# Patient Record
Sex: Female | Born: 1945 | Race: White | Hispanic: No | Smoking: Former smoker
Health system: Southern US, Community
[De-identification: ages and names within clinical notes are randomized; demographics above are authoritative.]

## PROBLEM LIST (undated history)

## (undated) DIAGNOSIS — C801 Malignant (primary) neoplasm, unspecified: Secondary | ICD-10-CM

## (undated) DIAGNOSIS — E119 Type 2 diabetes mellitus without complications: Secondary | ICD-10-CM

## (undated) HISTORY — PX: MASTECTOMY: SHX3

---

## 2017-05-02 ENCOUNTER — Other Ambulatory Visit: Payer: Self-pay | Admitting: Family Medicine

## 2017-05-02 DIAGNOSIS — E2839 Other primary ovarian failure: Secondary | ICD-10-CM

## 2017-06-13 ENCOUNTER — Ambulatory Visit
Admission: RE | Admit: 2017-06-13 | Discharge: 2017-06-13 | Disposition: A | Discharge status: Home / Self Care | Payer: Medicare Other | Source: Ambulatory Visit | Attending: Family Medicine | Admitting: Family Medicine

## 2017-06-13 ENCOUNTER — Ambulatory Visit
Admission: RE | Admit: 2017-06-13 | Discharge: 2017-06-13 | Disposition: A | Discharge status: Home / Self Care | Payer: Self-pay | Source: Ambulatory Visit | Attending: Family Medicine | Admitting: Family Medicine

## 2017-06-13 DIAGNOSIS — E2839 Other primary ovarian failure: Secondary | ICD-10-CM

## 2017-07-31 ENCOUNTER — Other Ambulatory Visit: Payer: Self-pay | Admitting: Family Medicine

## 2017-07-31 DIAGNOSIS — Z1231 Encounter for screening mammogram for malignant neoplasm of breast: Secondary | ICD-10-CM

## 2017-10-05 ENCOUNTER — Other Ambulatory Visit: Payer: Self-pay

## 2017-10-05 ENCOUNTER — Encounter (HOSPITAL_COMMUNITY): Payer: Self-pay

## 2017-10-05 ENCOUNTER — Emergency Department (HOSPITAL_COMMUNITY)
Admission: EM | Admit: 2017-10-05 | Discharge: 2017-10-05 | Disposition: A | Discharge status: Home / Self Care | Payer: Medicare Other | Attending: Emergency Medicine | Admitting: Emergency Medicine

## 2017-10-05 DIAGNOSIS — Z87891 Personal history of nicotine dependence: Secondary | ICD-10-CM | POA: Insufficient documentation

## 2017-10-05 DIAGNOSIS — E119 Type 2 diabetes mellitus without complications: Secondary | ICD-10-CM | POA: Insufficient documentation

## 2017-10-05 DIAGNOSIS — Z859 Personal history of malignant neoplasm, unspecified: Secondary | ICD-10-CM | POA: Diagnosis not present

## 2017-10-05 DIAGNOSIS — R42 Dizziness and giddiness: Secondary | ICD-10-CM | POA: Diagnosis present

## 2017-10-05 DIAGNOSIS — H8120 Vestibular neuronitis, unspecified ear: Secondary | ICD-10-CM

## 2017-10-05 HISTORY — DX: Type 2 diabetes mellitus without complications: E11.9

## 2017-10-05 HISTORY — DX: Malignant (primary) neoplasm, unspecified: C80.1

## 2017-10-05 LAB — COMPREHENSIVE METABOLIC PANEL
ALK PHOS: 91 U/L (ref 38–126)
ALT: 16 U/L (ref 0–44)
AST: 16 U/L (ref 15–41)
Albumin: 4.2 g/dL (ref 3.5–5.0)
Anion gap: 10 (ref 5–15)
BILIRUBIN TOTAL: 0.5 mg/dL (ref 0.3–1.2)
BUN: 16 mg/dL (ref 8–23)
CALCIUM: 9.3 mg/dL (ref 8.9–10.3)
CO2: 27 mmol/L (ref 22–32)
Chloride: 102 mmol/L (ref 98–111)
Creatinine, Ser: 0.66 mg/dL (ref 0.44–1.00)
GFR calc non Af Amer: >60 mL/min (ref >60)
Glucose, Bld: 128 mg/dL — ABNORMAL HIGH (ref 70–99)
POTASSIUM: 3.9 mmol/L (ref 3.5–5.1)
Sodium: 139 mmol/L (ref 135–145)
TOTAL PROTEIN: 7.1 g/dL (ref 6.5–8.1)

## 2017-10-05 LAB — CBG MONITORING, ED: GLUCOSE-CAPILLARY: 157 mg/dL — AB (ref 70–99)

## 2017-10-05 LAB — CBC WITH DIFFERENTIAL/PLATELET
Basophils Absolute: 0 10*3/uL (ref 0.0–0.1)
Basophils Relative: 0 %
EOS PCT: 0 %
Eosinophils Absolute: 0 10*3/uL (ref 0.0–0.7)
HCT: 39.6 % (ref 36.0–46.0)
HEMOGLOBIN: 13.4 g/dL (ref 12.0–15.0)
LYMPHS ABS: 1.4 10*3/uL (ref 0.7–4.0)
LYMPHS PCT: 14 %
MCH: 29.6 pg (ref 26.0–34.0)
MCHC: 33.8 g/dL (ref 30.0–36.0)
MCV: 87.4 fL (ref 78.0–100.0)
MONO ABS: 0.7 10*3/uL (ref 0.1–1.0)
MONOS PCT: 6 %
Neutro Abs: 8.2 10*3/uL — ABNORMAL HIGH (ref 1.7–7.7)
Neutrophils Relative %: 80 %
Platelets: 261 10*3/uL (ref 150–400)
RBC: 4.53 MIL/uL (ref 3.87–5.11)
RDW: 12.6 % (ref 11.5–15.5)
WBC: 10.3 10*3/uL (ref 4.0–10.5)

## 2017-10-05 LAB — URINALYSIS, ROUTINE W REFLEX MICROSCOPIC
Bilirubin Urine: NEGATIVE
Glucose, UA: NEGATIVE mg/dL
Hgb urine dipstick: NEGATIVE
KETONES UR: 5 mg/dL — AB
Leukocytes, UA: NEGATIVE
NITRITE: NEGATIVE
PROTEIN: NEGATIVE mg/dL
Specific Gravity, Urine: 1.017 (ref 1.005–1.030)
pH: 7 (ref 5.0–8.0)

## 2017-10-05 LAB — LIPASE, BLOOD: Lipase: 40 U/L (ref 11–51)

## 2017-10-05 MED ORDER — METHYLPREDNISOLONE SODIUM SUCC 125 MG IJ SOLR
125.0000 mg | Freq: Once | INTRAMUSCULAR | Status: AC
  Administered 2017-10-05: 125 mg via INTRAVENOUS
  Filled 2017-10-05: qty 2

## 2017-10-05 MED ORDER — MECLIZINE HCL 25 MG PO TABS
25.0000 mg | ORAL_TABLET | Freq: Once | ORAL | Status: AC
  Administered 2017-10-05: 25 mg via ORAL
  Filled 2017-10-05: qty 1

## 2017-10-05 MED ORDER — MECLIZINE HCL 25 MG PO TABS: 25.0000 mg | ORAL_TABLET | Freq: Three times a day (TID) | ORAL | 0 refills | Status: AC | PRN

## 2017-10-05 MED ORDER — PREDNISONE 20 MG PO TABS: ORAL_TABLET | ORAL | 0 refills | Status: AC

## 2017-10-05 MED ORDER — LACTATED RINGERS IV BOLUS
1000.0000 mL | Freq: Once | INTRAVENOUS | Status: AC
  Administered 2017-10-05: 1000 mL via INTRAVENOUS

## 2017-10-05 NOTE — ED Triage Notes (Addendum)
EMS reports patient was having Nausea, Vomiting, abdominal pain staring approx 1 hour ago. Patient started new abx yesterday for sinus infection. Hx of Diabetes. A/O x4 -- 4mg  zofran IV given via EMS. Patient states she feels better.

## 2017-10-05 NOTE — ED Provider Notes (Signed)
[facility] DEPT Provider Note   CSN: [id number] Arrival date & time: 10/05/17  1828     History   Chief Complaint Chief Complaint  Patient presents with  . Nausea  . Emesis  . Abdominal Pain    HPI Teresa Harrison is a 72 y.o. female.  Currently being treated for sinus infection.  Today she had an episode where she was getting out of car and she has significant vertigo where she had to sit back down.  Had to call her son she had nausea and multiple episodes of vomiting as well.  She is astigmatic at this time.  No headache.  No chest pain.   Emesis   This is a new problem. The problem occurs 2 to 4 times per day. The problem has not changed since onset.There has been no fever. Associated symptoms include abdominal pain.  Abdominal Pain   Chronicity: after vomiting. The current episode started 3 to 5 hours ago. The problem occurs constantly. The pain is located in the generalized abdominal region. Associated symptoms include vomiting.  Dizziness  Associated symptoms: vomiting     Past Medical History:  Diagnosis Date  . Cancer ([place])   . Diabetes mellitus without complication ([place])     There are no active problems to display for this patient.   The histories are not reviewed yet. Please review them in the "History" navigator section and refresh this [place].   OB History    Gravida  6   Para  6   Term  4   Preterm  2   AB      Living  6     SAB      TAB      Ectopic      Multiple      Live Births               Home Medications    Prior to Admission medications   Medication Sig Start Date End Date Taking? Authorizing Provider  meclizine (ANTIVERT) 25 MG tablet Take 1 tablet (25 mg total) by mouth 3 (three) times daily as needed for dizziness. 10/05/17   [place] Fritze, Corene Cornea, MD  predniSONE (DELTASONE) 20 MG tablet 3 tabs po daily x 3 days, then 2 tabs x 3 days, then 1.5 tabs x 3 days, then 1 tab x 3 days, then 0.5 tabs  x 3 days 10/05/17   Rodricus Candelaria, Corene Cornea, MD    Family History No family history on file.  Social History Social History   Tobacco Use  . Smoking status: Former Research scientist (life sciences)  . Smokeless tobacco: Never Used  Substance Use Topics  . Alcohol use: Not Currently  . Drug use: Not Currently     Allergies   Patient has no allergy information on record.   Review of Systems Review of Systems  Gastrointestinal: Positive for abdominal pain and vomiting.  Neurological: Positive for dizziness.  All other systems reviewed and are negative.    Physical Exam Updated Vital Signs BP (!) 152/71 (BP Location: Left Arm)   Pulse 67   Temp 98.3 F (36.8 C) (Oral)   Resp 17   Ht 5\' 5"  (1.651 m)   Wt 77.6 kg   SpO2 100%   BMI 28.46 kg/m   Physical Exam  Constitutional: She is oriented to person, place, and time. She appears well-developed and well-nourished.  HENT:  Head: Normocephalic and atraumatic.  Eyes: Conjunctivae and EOM are normal.  Neck: Normal range of motion.  Cardiovascular: Normal rate and regular rhythm.  Pulmonary/Chest: Effort normal. No stridor. No respiratory distress.  Abdominal: Soft. She exhibits no distension.  Neurological: She is alert and oriented to person, place, and time.  Skin: Skin is warm and dry.  Nursing note and vitals reviewed.    ED Treatments / Results  Labs (all labs ordered are listed, but only abnormal results are displayed) Labs Reviewed  CBC WITH DIFFERENTIAL/PLATELET - Abnormal; Notable for the following components:      Result Value   Neutro Abs 8.2 (*)    All other components within normal limits  COMPREHENSIVE METABOLIC PANEL - Abnormal; Notable for the following components:   Glucose, Bld 128 (*)    All other components within normal limits  URINALYSIS, ROUTINE W REFLEX MICROSCOPIC - Abnormal; Notable for the following components:   Ketones, ur 5 (*)    All other components within normal limits  CBG MONITORING, ED - Abnormal; Notable  for the following components:   Glucose-Capillary 157 (*)    All other components within normal limits  LIPASE, BLOOD    EKG None  Radiology No results found.  Procedures Procedures (including critical care time)  Medications Ordered in ED Medications  meclizine (ANTIVERT) tablet 25 mg (25 mg Oral Given 10/05/17 1959)  lactated ringers bolus 1,000 mL (0 mLs Intravenous Stopped 10/05/17 2147)  methylPREDNISolone sodium succinate (SOLU-MEDROL) 125 mg/2 mL injection 125 mg (125 mg Intravenous Given 10/05/17 1956)     Initial Impression / Assessment and Plan / ED Course  I have reviewed the triage vital signs and the nursing notes.  Pertinent labs & imaging results that were available during my care of the patient were reviewed by me and considered in my medical decision making (see chart for details).   Here with likely vestibular neuritis. Steroids/meclizine/continue antibiotics. Stable for dc. Low suspicion for CVA  Final Clinical Impressions(s) / ED Diagnoses   Final diagnoses:  Vertigo  Vestibular neuronitis, unspecified laterality    ED Discharge Orders         Ordered    meclizine (ANTIVERT) 25 MG tablet  3 times daily PRN     10/05/17 2134    predniSONE (DELTASONE) 20 MG tablet     10/05/17 2134           Deverick Pruss, Corene Cornea, MD 10/06/17 0000

## 2017-10-21 ENCOUNTER — Ambulatory Visit
Admission: RE | Admit: 2017-10-21 | Discharge: 2017-10-21 | Disposition: A | Discharge status: Home / Self Care | Payer: Medicare Other | Source: Ambulatory Visit | Attending: Family Medicine | Admitting: Family Medicine

## 2017-10-21 DIAGNOSIS — Z1231 Encounter for screening mammogram for malignant neoplasm of breast: Secondary | ICD-10-CM

## 2018-09-17 ENCOUNTER — Other Ambulatory Visit: Payer: Self-pay | Admitting: Family Medicine

## 2018-09-17 DIAGNOSIS — Z1231 Encounter for screening mammogram for malignant neoplasm of breast: Secondary | ICD-10-CM

## 2018-11-03 ENCOUNTER — Ambulatory Visit
Admission: RE | Admit: 2018-11-03 | Discharge: 2018-11-03 | Disposition: A | Discharge status: Home / Self Care | Payer: Medicare Other | Source: Ambulatory Visit | Attending: Family Medicine | Admitting: Family Medicine

## 2018-11-03 ENCOUNTER — Other Ambulatory Visit: Payer: Self-pay

## 2018-11-03 DIAGNOSIS — Z1231 Encounter for screening mammogram for malignant neoplasm of breast: Secondary | ICD-10-CM

## 2019-10-26 ENCOUNTER — Other Ambulatory Visit: Payer: Self-pay | Admitting: Family Medicine

## 2019-10-26 DIAGNOSIS — Z1231 Encounter for screening mammogram for malignant neoplasm of breast: Secondary | ICD-10-CM

## 2019-12-02 ENCOUNTER — Ambulatory Visit: Payer: Medicare Other

## 2019-12-11 ENCOUNTER — Other Ambulatory Visit: Payer: Self-pay

## 2019-12-11 ENCOUNTER — Ambulatory Visit
Admission: RE | Admit: 2019-12-11 | Discharge: 2019-12-11 | Disposition: A | Discharge status: Home / Self Care | Payer: Medicare Other | Source: Ambulatory Visit | Attending: Family Medicine | Admitting: Family Medicine

## 2019-12-11 DIAGNOSIS — Z1231 Encounter for screening mammogram for malignant neoplasm of breast: Secondary | ICD-10-CM

## 2020-10-05 ENCOUNTER — Other Ambulatory Visit: Payer: Self-pay | Admitting: Family Medicine

## 2020-10-05 DIAGNOSIS — M810 Age-related osteoporosis without current pathological fracture: Secondary | ICD-10-CM

## 2020-10-21 LAB — EXTERNAL GENERIC LAB PROCEDURE

## 2020-10-21 LAB — COLOGUARD

## 2020-11-14 ENCOUNTER — Other Ambulatory Visit: Payer: Self-pay | Admitting: Family Medicine

## 2020-11-14 DIAGNOSIS — Z1231 Encounter for screening mammogram for malignant neoplasm of breast: Secondary | ICD-10-CM

## 2020-11-18 LAB — COLOGUARD: COLOGUARD: NEGATIVE

## 2020-12-20 ENCOUNTER — Ambulatory Visit: Payer: Medicare Other

## 2021-02-01 ENCOUNTER — Ambulatory Visit
Admission: RE | Admit: 2021-02-01 | Discharge: 2021-02-01 | Disposition: A | Discharge status: Home / Self Care | Payer: Medicare Other | Source: Ambulatory Visit | Attending: Family Medicine | Admitting: Family Medicine

## 2021-02-01 ENCOUNTER — Other Ambulatory Visit: Payer: Self-pay

## 2021-02-01 DIAGNOSIS — Z1231 Encounter for screening mammogram for malignant neoplasm of breast: Secondary | ICD-10-CM

## 2021-04-11 ENCOUNTER — Other Ambulatory Visit: Payer: Self-pay | Admitting: Family Medicine

## 2021-04-11 DIAGNOSIS — E2839 Other primary ovarian failure: Secondary | ICD-10-CM

## 2021-04-18 ENCOUNTER — Other Ambulatory Visit: Payer: Medicare Other

## 2022-03-12 ENCOUNTER — Other Ambulatory Visit: Payer: Self-pay | Admitting: Family Medicine

## 2022-03-12 DIAGNOSIS — Z0000 Encounter for general adult medical examination without abnormal findings: Secondary | ICD-10-CM

## 2022-03-14 ENCOUNTER — Ambulatory Visit
Admission: RE | Admit: 2022-03-14 | Discharge: 2022-03-14 | Disposition: A | Discharge status: Home / Self Care | Payer: Medicare Other | Source: Ambulatory Visit | Attending: Family Medicine | Admitting: Family Medicine

## 2022-03-14 DIAGNOSIS — Z0000 Encounter for general adult medical examination without abnormal findings: Secondary | ICD-10-CM

## 2022-10-22 ENCOUNTER — Other Ambulatory Visit: Payer: Self-pay | Admitting: Family Medicine

## 2022-10-22 DIAGNOSIS — M810 Age-related osteoporosis without current pathological fracture: Secondary | ICD-10-CM

## 2023-03-14 LAB — COLOGUARD

## 2023-03-14 LAB — EXTERNAL GENERIC LAB PROCEDURE

## 2023-03-27 ENCOUNTER — Other Ambulatory Visit: Payer: Self-pay | Admitting: Family Medicine

## 2023-03-27 DIAGNOSIS — M810 Age-related osteoporosis without current pathological fracture: Secondary | ICD-10-CM

## 2023-04-18 LAB — COLOGUARD: COLOGUARD: NEGATIVE

## 2023-06-05 ENCOUNTER — Other Ambulatory Visit: Payer: Medicare Other

## 2023-06-27 ENCOUNTER — Other Ambulatory Visit: Payer: Self-pay | Admitting: Family Medicine

## 2023-06-27 DIAGNOSIS — Z1231 Encounter for screening mammogram for malignant neoplasm of breast: Secondary | ICD-10-CM

## 2023-07-03 ENCOUNTER — Ambulatory Visit
Admission: RE | Admit: 2023-07-03 | Discharge: 2023-07-03 | Disposition: A | Discharge status: Home / Self Care | Source: Ambulatory Visit | Attending: Family Medicine | Admitting: Family Medicine

## 2023-07-03 ENCOUNTER — Other Ambulatory Visit: Payer: Self-pay | Admitting: Family Medicine

## 2023-07-03 ENCOUNTER — Encounter: Payer: Self-pay | Admitting: Family Medicine

## 2023-07-03 DIAGNOSIS — Z1231 Encounter for screening mammogram for malignant neoplasm of breast: Secondary | ICD-10-CM

## 2023-07-17 IMAGING — MG MM DIGITAL SCREENING UNILAT*R* W/ TOMO W/ CAD
6 series · 6 of 18 positions shown · non-contrast
Comparison: Previous exam(s).

CLINICAL DATA: Screening.

EXAM:
DIGITAL SCREENING UNILATERAL RIGHT MAMMOGRAM WITH CAD AND
TOMOSYNTHESIS
TECHNIQUE: Right screening digital craniocaudal and mediolateral oblique
mammograms were obtained. Right screening digital breast
tomosynthesis was performed. The images were evaluated with
computer-aided detection.

[R CC synth-2D (1 of 2)]
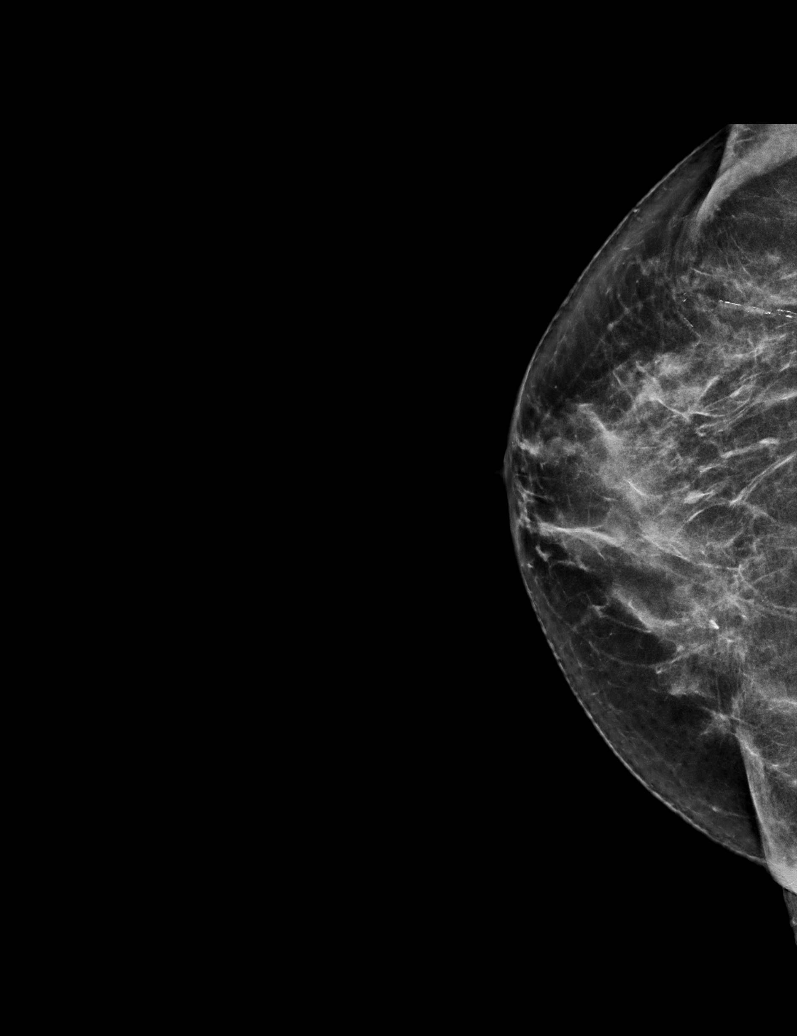

[R CC synth-2D (2 of 2)]
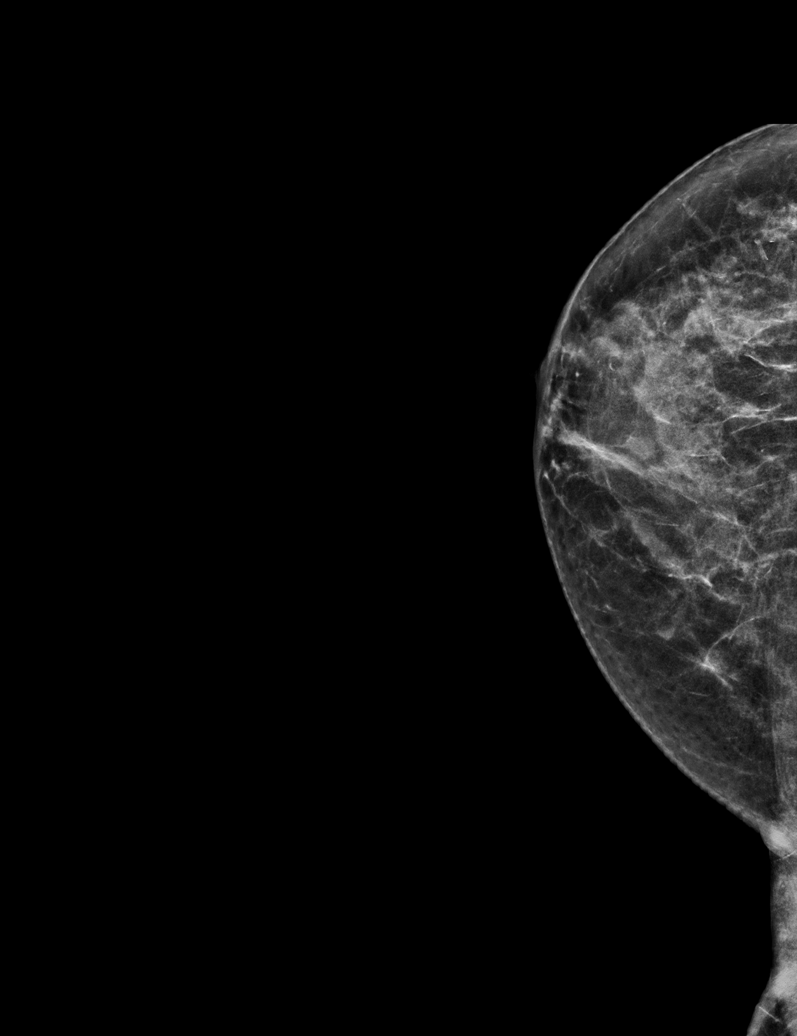

[R MLO synth-2D]
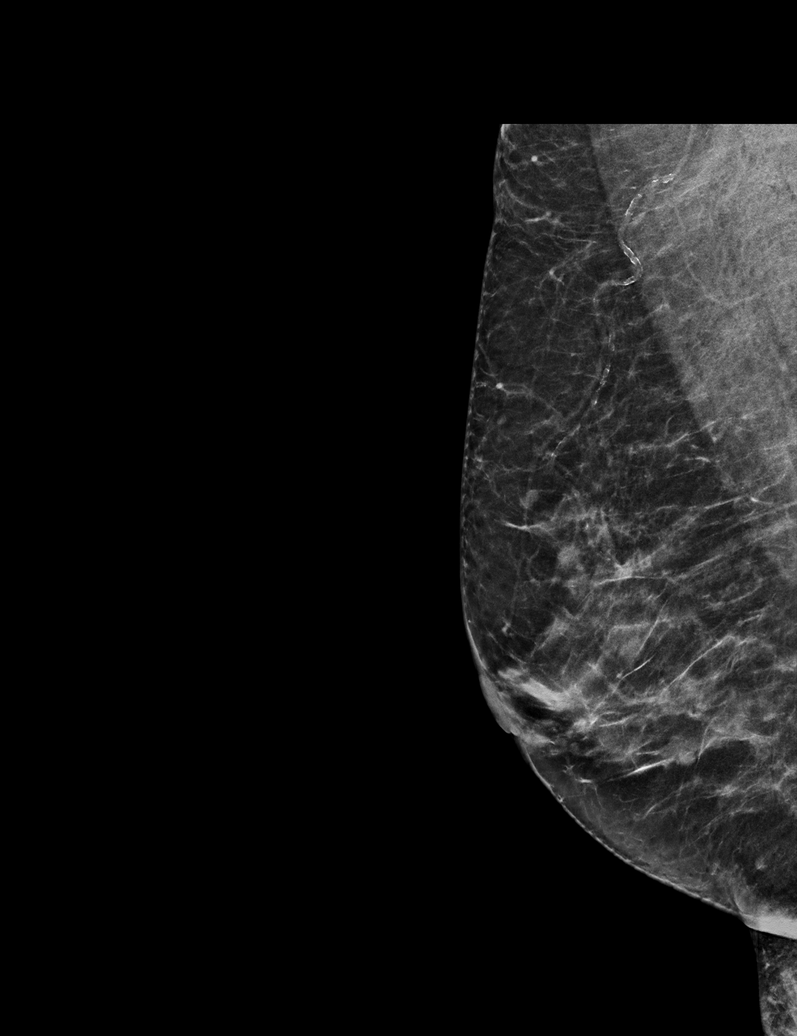

[R MLO tomo · tomo slice 27/53.0]
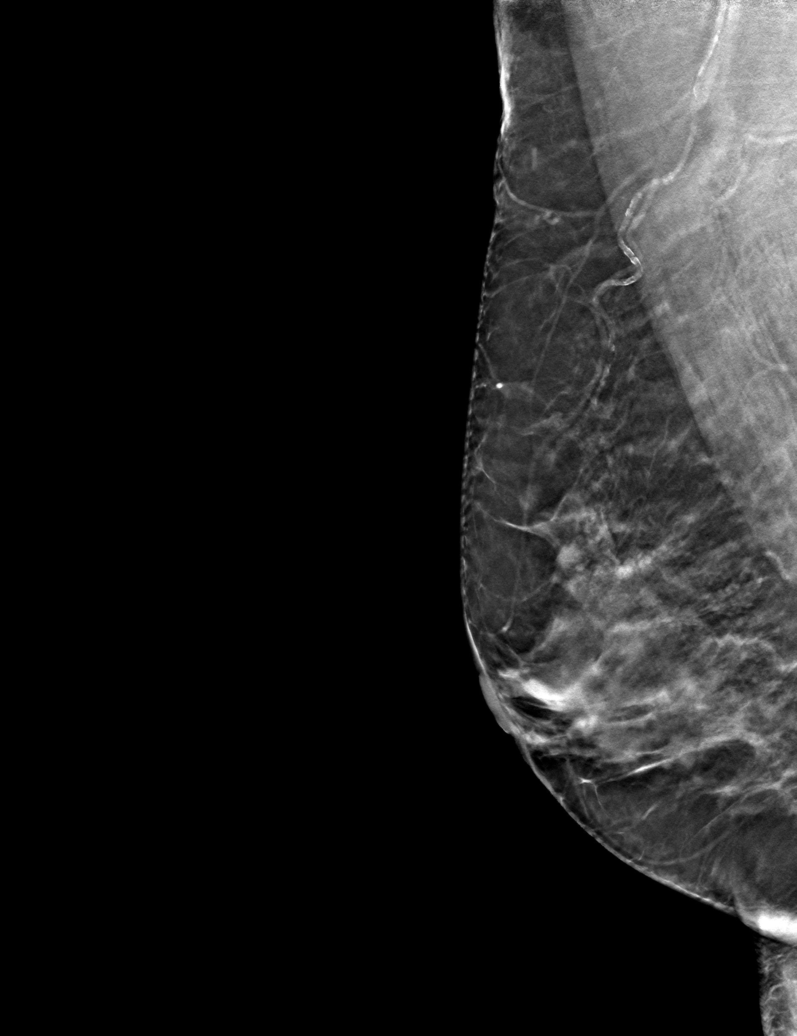

[R CC tomo (1 of 2) · tomo slice 31/62.0]
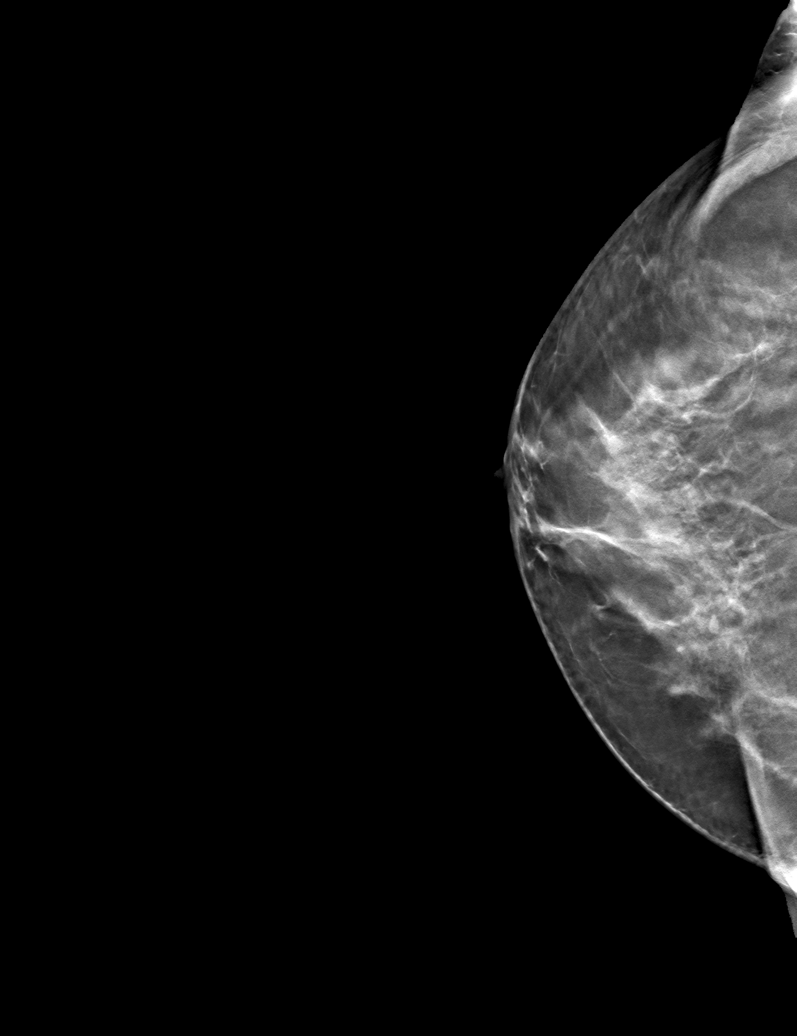

[R CC tomo (2 of 2) · tomo slice 27/54.0]
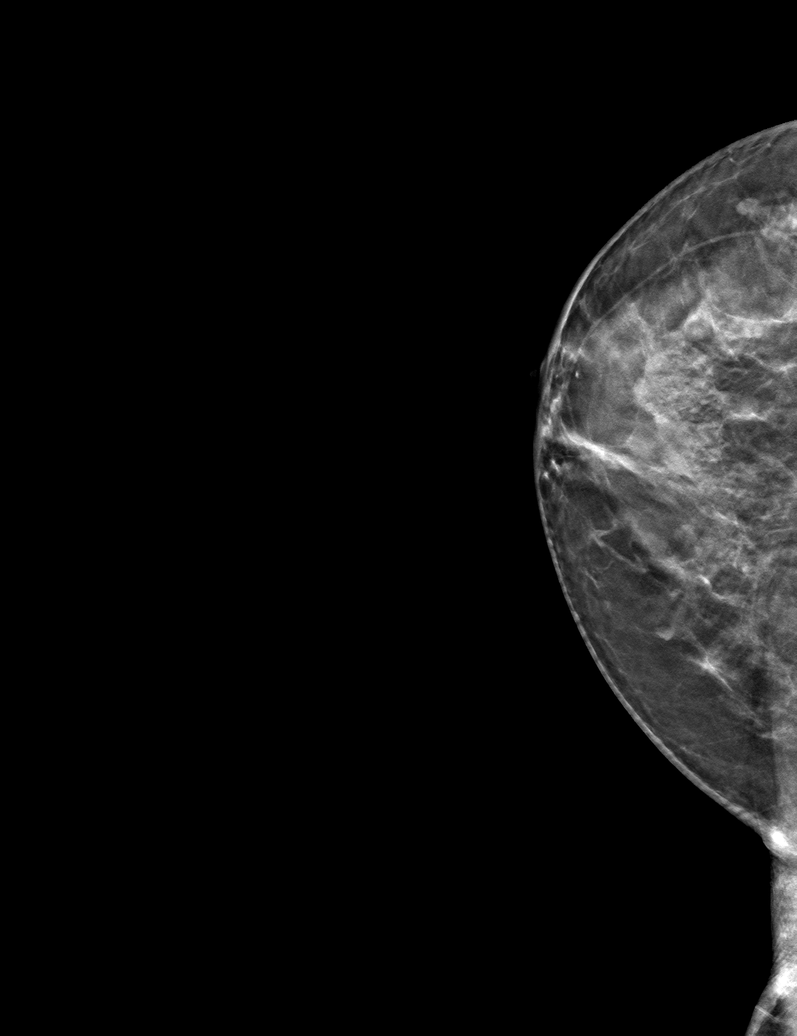

[6 of 18 positions shown; findings below may reference images not displayed]

ACR Breast Density Category c: The breast tissue is heterogeneously
dense, which may obscure small masses.
FINDINGS: The patient has had a left mastectomy. There are no findings
suspicious for malignancy.
IMPRESSION: No mammographic evidence of malignancy. A result letter of this
screening mammogram will be mailed directly to the patient.

RECOMMENDATION:
Screening mammogram in one year.  (Code:V6-Y-3H6)

BI-RADS CATEGORY  1: Negative.

## 2023-09-03 LAB — LAB REPORT - SCANNED
A1c: 6.9
EGFR: 95
TSH: 1.06 (ref 0.41–5.90)

## 2023-12-03 ENCOUNTER — Encounter (HOSPITAL_BASED_OUTPATIENT_CLINIC_OR_DEPARTMENT_OTHER): Payer: Self-pay

## 2023-12-03 ENCOUNTER — Other Ambulatory Visit

## 2023-12-03 ENCOUNTER — Encounter: Admitting: Skilled Nursing Facility1

## 2023-12-03 ENCOUNTER — Other Ambulatory Visit (HOSPITAL_BASED_OUTPATIENT_CLINIC_OR_DEPARTMENT_OTHER)

## 2023-12-03 ENCOUNTER — Encounter: Payer: Self-pay | Admitting: Skilled Nursing Facility1

## 2023-12-03 DIAGNOSIS — E119 Type 2 diabetes mellitus without complications: Secondary | ICD-10-CM | POA: Insufficient documentation

## 2023-12-03 NOTE — Progress Notes (Signed)
 Pt states she does check her blood sugars seeing about 140-160's.   DM Medications: Metformin   A1C 6.9 (09/03/2023)  Pt states her appetite is okay. Pt states her son does live with her. Pt states she cooks for herself and her son. 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 Pt state she likes diamond painting and word searches. Pt states she takes some vitamins but cannot remember the names. Pt states she enjoys baking cookies stating she has been attempting the use of whole wheat flour which has gone well.  Diabetes Self-Management Education  Visit Type: First/Initial  Appt. Start Time: 1:56 Appt. End Time: 2:58  12/03/2023  Ms. Teresa Harrison, identified by name and date of birth, is a 78 y.o. female with a diagnosis of Diabetes: Type 2.   ASSESSMENT  There were no vitals taken for this visit. There is no height or weight on file to calculate BMI.   Diabetes Self-Management Education - 12/03/23 1406       Visit Information   Visit Type First/Initial      Initial Visit   Diabetes Type Type 2    Are you currently following a meal plan? No    Are you taking your medications as prescribed? Yes      Health Coping   How would you rate your overall health? Good      Psychosocial Assessment   Patient Belief/Attitude about Diabetes Motivated to manage diabetes    What is the hardest part about your diabetes right now, causing you the most concern, or is the most worrisome to you about your diabetes?   Making healty food and beverage choices;Being active    Self-care barriers Debilitated state due to current medical condition    Self-management support Family;Friends    Patient Concerns Nutrition/Meal planning;Healthy Lifestyle    Special Needs None    Preferred Learning Style Visual;Auditory    Learning Readiness Contemplating    How often do you need to have someone help you when you read instructions, pamphlets, or other written materials from your doctor or pharmacy?  1 - Never      Pre-Education Assessment   Patient understands the diabetes disease and treatment process. Needs Instruction    Patient understands incorporating nutritional management into lifestyle. Needs Instruction    Patient undertands incorporating physical activity into lifestyle. Needs Instruction    Patient understands using medications safely. Needs Instruction    Patient understands monitoring blood glucose, interpreting and using results Needs Instruction    Patient understands prevention, detection, and treatment of acute complications. Needs Instruction    Patient understands prevention, detection, and treatment of chronic complications. Needs Instruction    Patient understands how to develop strategies to address psychosocial issues. Needs Instruction    Patient understands how to develop strategies to promote health/change behavior. Needs Instruction      Complications   Last HgB A1C per patient/outside source 7.3 %    How often do you check your blood sugar? 1-2 times/day    Number of hypoglycemic episodes per month 0      Dietary Intake   Breakfast oatmeal + raisins + alternative sugar + almond milk    Lunch pizza or sandwich    Dinner steak + broccoli + sweet potatoes    Beverage(s) coffee, sparkling water, ice tea with no sugar      Activity / Exercise   Activity / Exercise Type ADL's    How many days per  week do you exercise? 0    How many minutes per day do you exercise? 0    Total minutes per week of exercise 0      Patient Education   Previous Diabetes Education No    Disease Pathophysiology Factors that contribute to the development of diabetes    Healthy Eating Role of diet in the treatment of diabetes and the relationship between the three main macronutrients and blood glucose level;Food label reading, portion sizes and measuring food.;Plate Method;Reviewed blood glucose goals for pre and post meals and how to evaluate the patients' food intake on their  blood glucose level.;Meal options for control of blood glucose level and chronic complications.    Being Active Role of exercise on diabetes management, blood pressure control and cardiac health.;Identified with patient nutritional and/or medication changes necessary with exercise.    Medications Reviewed patients medication for diabetes, action, purpose, timing of dose and side effects.    Monitoring Taught/evaluated SMBG meter.;Daily foot exams;Yearly dilated eye exam;Taught/discussed recording of test results and interpretation of SMBG.    Acute complications Taught prevention, symptoms, and  treatment of hypoglycemia - the 15 rule.;Discussed and identified patients' prevention, symptoms, and treatment of hyperglycemia.    Chronic complications Relationship between chronic complications and blood glucose control;Assessed and discussed foot care and prevention of foot problems;Dental care;Nephropathy, what it is, prevention of, the use of ACE, ARB's and early detection of through urine microalbumia.;Retinopathy and reason for yearly dilated eye exams;Lipid levels, blood glucose control and heart disease    Diabetes Stress and Support Role of stress on diabetes;Worked with patient to identify barriers to care and solutions    Lifestyle and Health Coping Lifestyle issues that need to be addressed for better diabetes care      Individualized Goals (developed by patient)   Nutrition Follow meal plan discussed;General guidelines for healthy choices and portions discussed    Physical Activity Exercise 1-2 times per week;30 minutes per day    Medications take my medication as prescribed    Monitoring  Test my blood glucose as discussed;Test blood glucose pre and post meals as discussed    Problem Solving Eating Pattern    Reducing Risk do foot checks daily;treat hypoglycemia with 15 grams of carbs if blood glucose less than 70mg /dL      Post-Education Assessment   Patient understands the diabetes  disease and treatment process. Demonstrates understanding / competency    Patient understands incorporating nutritional management into lifestyle. Demonstrates understanding / competency    Patient undertands incorporating physical activity into lifestyle. Demonstrates understanding / competency    Patient understands using medications safely. Demonstrates understanding / competency    Patient understands monitoring blood glucose, interpreting and using results Demonstrates understanding / competency    Patient understands prevention, detection, and treatment of acute complications. Demonstrates understanding / competency    Patient understands prevention, detection, and treatment of chronic complications. Demonstrates understanding / competency    Patient understands how to develop strategies to address psychosocial issues. Demonstrates understanding / competency    Patient understands how to develop strategies to promote health/change behavior. Demonstrates understanding / competency      Outcomes   Expected Outcomes Demonstrated interest in learning but significant barriers to change    Future DMSE PRN    Program Status Completed          Individualized Plan for Diabetes Self-Management Training:   Learning Objective:  Patient will have a greater understanding of diabetes self-management. Patient education plan  is to attend individual and/or group sessions per assessed needs and concerns.    Education material provided: ADA - How to Thrive: A Guide for Your Journey with Diabetes, Food label handouts, Meal plan card, My Plate, and Snack sheet  If problems or questions, patient to contact team via:  Phone and Email  Future DSME appointment: PRN

## 2024-03-30 ENCOUNTER — Other Ambulatory Visit (HOSPITAL_BASED_OUTPATIENT_CLINIC_OR_DEPARTMENT_OTHER)
